# Patient Record
Sex: Female | Born: 1990 | Hispanic: No | Smoking: Former smoker
Health system: Southern US, Community
[De-identification: ages and names within clinical notes are randomized; demographics above are authoritative.]

## PROBLEM LIST (undated history)

## (undated) ENCOUNTER — Inpatient Hospital Stay (HOSPITAL_COMMUNITY): Payer: Self-pay

---
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
## 2016-07-07 ENCOUNTER — Emergency Department (HOSPITAL_COMMUNITY)
Admission: EM | Admit: 2016-07-07 | Discharge: 2016-07-08 | Disposition: A | Discharge status: Home / Self Care | Payer: Managed Care, Other (non HMO) | Attending: Emergency Medicine | Admitting: Emergency Medicine

## 2016-07-07 ENCOUNTER — Emergency Department (HOSPITAL_COMMUNITY): Payer: Managed Care, Other (non HMO)

## 2016-07-07 ENCOUNTER — Encounter (HOSPITAL_COMMUNITY): Payer: Self-pay

## 2016-07-07 DIAGNOSIS — Z79899 Other long term (current) drug therapy: Secondary | ICD-10-CM | POA: Insufficient documentation

## 2016-07-07 DIAGNOSIS — F17200 Nicotine dependence, unspecified, uncomplicated: Secondary | ICD-10-CM | POA: Insufficient documentation

## 2016-07-07 DIAGNOSIS — Y929 Unspecified place or not applicable: Secondary | ICD-10-CM | POA: Diagnosis not present

## 2016-07-07 DIAGNOSIS — X509XXA Other and unspecified overexertion or strenuous movements or postures, initial encounter: Secondary | ICD-10-CM | POA: Insufficient documentation

## 2016-07-07 DIAGNOSIS — S82091A Other fracture of right patella, initial encounter for closed fracture: Secondary | ICD-10-CM | POA: Diagnosis not present

## 2016-07-07 DIAGNOSIS — Y9375 Activity, martial arts: Secondary | ICD-10-CM | POA: Insufficient documentation

## 2016-07-07 DIAGNOSIS — S8991XA Unspecified injury of right lower leg, initial encounter: Secondary | ICD-10-CM | POA: Diagnosis present

## 2016-07-07 DIAGNOSIS — Y999 Unspecified external cause status: Secondary | ICD-10-CM | POA: Diagnosis not present

## 2016-07-07 DIAGNOSIS — T148XXA Other injury of unspecified body region, initial encounter: Secondary | ICD-10-CM

## 2016-07-07 NOTE — ED Triage Notes (Signed)
Was doing karate workout and felt right knee pop no deformity noted good circulation and sensation noted limited movement. Unable to bear weight.

## 2016-07-08 MED ORDER — HYDROCODONE-ACETAMINOPHEN 5-325 MG PO TABS: 1.0000 | ORAL_TABLET | Freq: Four times a day (QID) | ORAL | 0 refills | Status: DC | PRN

## 2016-07-08 MED ORDER — IBUPROFEN 800 MG PO TABS: 800.0000 mg | ORAL_TABLET | Freq: Three times a day (TID) | ORAL | 0 refills | Status: DC | PRN

## 2016-07-08 NOTE — Discharge Instructions (Signed)
Follow up with the orthopedist provided.Ice and elevate the knee.

## 2016-07-09 NOTE — ED Provider Notes (Signed)
[facility]-EMERGENCY DEPT Provider Note   CSN: [id number] Arrival date & time: 07/07/16  2136     History   Chief Complaint Chief Complaint  Patient presents with  . Knee Pain    HPI Tara Hurley is a 26 y.o. female.  HPI Patient presents to the emergency department with right knee pain started after jujitsu class.  Patient states that she was practicing a maneuver when her partner had their leg wrapped around her leg and she twisted and felt a pop in her right knee.  The patient, states she is having pain over the anterior portion of the knee, states that movement and palpation make the pain worse.  She did not take any medications prior to arrival for her symptoms.  Patient denies numbness, weakness, back pain, nausea, vomiting, near syncope or syncope     History reviewed. No pertinent past medical history.  There are no active problems to display for this patient.   History reviewed. No pertinent surgical history.  OB History    No data available       Home Medications    Prior to Admission medications   Medication Sig Start Date End Date Taking? Authorizing Provider  HYDROcodone-acetaminophen (NORCO/VICODIN) 5-325 MG tablet Take 1 tablet by mouth every 6 (six) hours as needed for moderate pain. 07/08/16   Charlestine Night, PA-C  ibuprofen (ADVIL,MOTRIN) 800 MG tablet Take 1 tablet (800 mg total) by mouth every 8 (eight) hours as needed. 07/08/16   Charlestine Night, PA-C    Family History History reviewed. No pertinent family history.  Social History Social History  Substance Use Topics  . Smoking status: Current Some Day Smoker  . Smokeless tobacco: Never Used  . Alcohol use Yes     Allergies   Patient has no known allergies.   Review of Systems Review of Systems All other systems negative except as documented in the HPI. All pertinent positives and negatives as reviewed in the HPI.  Physical Exam Updated Vital Signs BP (!) 148/72 (BP Location: Right  Arm)   Pulse 70   Temp 98.5 F (36.9 C) (Oral)   Resp 14   LMP 06/23/2016   SpO2 99%   Physical Exam  Constitutional: She is oriented to person, place, and time. She appears well-developed and well-nourished. No distress.  HENT:  Head: Normocephalic and atraumatic.  Eyes: Pupils are equal, round, and reactive to light.  Pulmonary/Chest: Effort normal.  Musculoskeletal:       Right knee: She exhibits decreased range of motion and swelling. She exhibits no effusion, no ecchymosis, no deformity, no erythema, normal alignment, no LCL laxity and no MCL laxity. Tenderness found. No patellar tendon tenderness noted.       Legs: Neurological: She is alert and oriented to person, place, and time.  Skin: Skin is warm and dry.  Psychiatric: She has a normal mood and affect.  Nursing note and vitals reviewed.    ED Treatments / Results  Labs (all labs ordered are listed, but only abnormal results are displayed) Labs Reviewed - No data to display  EKG  EKG Interpretation None       Radiology Dg Knee Complete 4 Views Right  Result Date: 07/07/2016 CLINICAL DATA:  Anterior right knee pain. Pt states she felt a pop while practicing jiu-jitsu today. EXAM: RIGHT KNEE - COMPLETE 4+ VIEW COMPARISON:  None. FINDINGS: There is suggestion of focal deformity of the medial aspect of the patella seen only on one view. This could indicate  focal patellar fracture or retinacular avulsion. A small effusion is present. Otherwise, no fracture or dislocation identified in the right knee. No focal bone lesion or bone expansion. IMPRESSION: Focal avulsion fragment demonstrated on the medial aspect of the right patella with small effusion. Electronically Signed   By: Burman Nieves M.D.   On: 07/07/2016 23:55    Procedures Procedures (including critical care time)  Medications Ordered in ED Medications - No data to display   Initial Impression / Assessment and Plan / ED Course  I have reviewed the  triage vital signs and the nursing notes.  Pertinent labs & imaging results that were available during my care of the patient were reviewed by me and considered in my medical decision making (see chart for details).     Patient be placed in knee immobilizer and advised to follow up with orthopedics this area of small avulsion fragment could relate to a possible partial tear.  The patellar tendon, but the patient has good strength in the region.  She can raise his leg without difficulty  Final Clinical Impressions(s) / ED Diagnoses   Final diagnoses:  Avulsion fracture    New Prescriptions Discharge Medication List as of 07/08/2016 12:15 AM    START taking these medications   Details  HYDROcodone-acetaminophen (NORCO/VICODIN) 5-325 MG tablet Take 1 tablet by mouth every 6 (six) hours as needed for moderate pain., Starting Thu 07/08/2016, Print    ibuprofen (ADVIL,MOTRIN) 800 MG tablet Take 1 tablet (800 mg total) by mouth every 8 (eight) hours as needed., Starting Thu 07/08/2016, Print         Charlestine Night, PA-C 07/09/16 0112    Mancel Bale, MD 07/09/16 [phone]

## 2017-04-01 ENCOUNTER — Emergency Department (HOSPITAL_COMMUNITY)
Admission: EM | Admit: 2017-04-01 | Discharge: 2017-04-01 | Disposition: A | Discharge status: Home / Self Care | Payer: Medicaid Other | Attending: Emergency Medicine | Admitting: Emergency Medicine

## 2017-04-01 ENCOUNTER — Encounter (HOSPITAL_COMMUNITY): Payer: Self-pay | Admitting: Emergency Medicine

## 2017-04-01 DIAGNOSIS — O26891 Other specified pregnancy related conditions, first trimester: Secondary | ICD-10-CM

## 2017-04-01 DIAGNOSIS — O99330 Smoking (tobacco) complicating pregnancy, unspecified trimester: Secondary | ICD-10-CM | POA: Diagnosis not present

## 2017-04-01 DIAGNOSIS — F17210 Nicotine dependence, cigarettes, uncomplicated: Secondary | ICD-10-CM | POA: Insufficient documentation

## 2017-04-01 DIAGNOSIS — O9989 Other specified diseases and conditions complicating pregnancy, childbirth and the puerperium: Secondary | ICD-10-CM | POA: Insufficient documentation

## 2017-04-01 DIAGNOSIS — Z3A00 Weeks of gestation of pregnancy not specified: Secondary | ICD-10-CM | POA: Insufficient documentation

## 2017-04-01 DIAGNOSIS — Z79899 Other long term (current) drug therapy: Secondary | ICD-10-CM | POA: Diagnosis not present

## 2017-04-01 DIAGNOSIS — R109 Unspecified abdominal pain: Secondary | ICD-10-CM | POA: Insufficient documentation

## 2017-04-01 LAB — I-STAT BETA HCG BLOOD, ED (MC, WL, AP ONLY): I-stat hCG, quantitative: 1699.6 m[IU]/mL — ABNORMAL HIGH (ref <5)

## 2017-04-01 NOTE — ED Provider Notes (Signed)
[facility]-EMERGENCY DEPT Provider Note   CSN: [id number] Arrival date & time: 04/01/17  0957     History   Chief Complaint Chief Complaint  Patient presents with  . Abdominal Cramping  . Vaginal Bleeding    HPI Tara Hurley is a 27 y.o. female.  HPI   27 year old female whose never been pregnant before presenting with concern for potential pregnancy.  Patient states her last menstrual period was in late November.  She is usually regular with her period.  She recently did 2 pregnancy tests at home and it was positive.  She would like a confirmation test.  She does complain of some mild generalized abdominal discomfort for the past several days.  This morning she noticed some mild vaginal spotting.  She felt that her symptoms is similar to prior menstrual cramping.  No report of fever, chills, lightheadedness, dizziness, chest pain, shortness of breath, back pain, dysuria, hematuria, or rash.  Last alcohol use was 2 weeks ago.  She denies any history of tobacco abuse.    History reviewed. No pertinent past medical history.  There are no active problems to display for this patient.   History reviewed. No pertinent surgical history.  OB History    No data available       Home Medications    Prior to Admission medications   Medication Sig Start Date End Date Taking? Authorizing Provider  HYDROcodone-acetaminophen (NORCO/VICODIN) 5-325 MG tablet Take 1 tablet by mouth every 6 (six) hours as needed for moderate pain. 07/08/16   Lawyer, Cristal Deer, PA-C  ibuprofen (ADVIL,MOTRIN) 800 MG tablet Take 1 tablet (800 mg total) by mouth every 8 (eight) hours as needed. 07/08/16   Charlestine Night, PA-C    Family History No family history on file.  Social History Social History   Tobacco Use  . Smoking status: Current Some Day Smoker  . Smokeless tobacco: Never Used  Substance Use Topics  . Alcohol use: Yes  . Drug use: No     Allergies     Patient has no known allergies.   Review of Systems Review of Systems  All other systems reviewed and are negative.    Physical Exam Updated Vital Signs BP 128/78 (BP Location: Right Arm)   Pulse 73   Temp 98 F (36.7 C) (Oral)   Resp 16   LMP 02/08/2017   SpO2 100%   Physical Exam  Constitutional: She appears well-developed and well-nourished. No distress.  HENT:  Head: Atraumatic.  Eyes: Conjunctivae are normal.  Neck: Neck supple.  Cardiovascular: Normal rate and regular rhythm.  Pulmonary/Chest: Effort normal and breath sounds normal.  Abdominal: Soft. Bowel sounds are normal. She exhibits no distension. There is no tenderness.  Genitourinary:  Genitourinary Comments: Pelvic exam was deferred.  Neurological: She is alert.  Skin: No rash noted.  Psychiatric: She has a normal mood and affect.  Nursing note and vitals reviewed.    ED Treatments / Results  Labs (all labs ordered are listed, but only abnormal results are displayed) Labs Reviewed  I-STAT BETA HCG BLOOD, ED ([facility], [facility], [facility] ONLY) - Abnormal; Notable for the following components:      Result Value   I-stat hCG, quantitative 1,699.6 (*)    All other components within normal limits    EKG  EKG Interpretation None       Radiology No results found.  Procedures Procedures (including critical care time)  Medications Ordered in ED Medications - No data to display  Initial Impression / Assessment and Plan / ED Course  I have reviewed the triage vital signs and the nursing notes.  Pertinent labs & imaging results that were available during my care of the patient were reviewed by me and considered in my medical decision making (see chart for details).     BP 128/78 (BP Location: Right Arm)   Pulse 73   Temp 98 F (36.7 C) (Oral)   Resp 16   LMP 02/08/2017   SpO2 100%    Final Clinical Impressions(s) / ED Diagnoses   Final diagnoses:  Abdominal pain during pregnancy in first trimester     ED Discharge Orders    None     3:12 PM Patient comes in for possible pregnancy after having 2+ pregnancy tests at home.  Her i-STAT hCG is 1699, confirming her possible pregnancy.  She does report some mild abdominal cramping as well as having some vaginal spotting this morning.  I offered a pelvic examination to rule out potential ectopic, threatened miscarriage, or potential STI that he complicated pregnancy.  However patient at this time declined further testing.  She prefers to follow-up with an OB/GYN for further management of her pregnancy.  She understands to return if her condition worsen.  She is well-appearing, and have a benign abdominal exam.  Patient will be discharged home recommend close follow-up with [facility].  Also recommend avoid alcohol tobacco use or any over-the-counter medication that can be harmful for the pregnancy.  Encourage prenatal vitamin.   Fayrene Helperran, Janelli Welling, PA-C 04/01/17 1515    Derwood KaplanNanavati, Ankit, MD 04/02/17 1601

## 2017-04-01 NOTE — ED Notes (Signed)
Pt ambulatory and independent at discharge.  Verbalized understanding of discharge instructions 

## 2017-04-01 NOTE — ED Triage Notes (Signed)
Patient reports that she took home pregnancy test on 6th and was positive. Patient reports that she been having abd cramping since. Today she having bright red spotting.

## 2017-04-01 NOTE — Discharge Instructions (Signed)
Please follow up closely with [facility] for further management of your pregnancy.  Your hCG today is 1699.

## 2017-04-02 ENCOUNTER — Inpatient Hospital Stay (HOSPITAL_COMMUNITY): Payer: Medicaid Other

## 2017-04-02 ENCOUNTER — Inpatient Hospital Stay (HOSPITAL_COMMUNITY)
Admission: AD | Admit: 2017-04-02 | Discharge: 2017-04-02 | Disposition: A | Discharge status: Home / Self Care | Payer: Medicaid Other | Source: Ambulatory Visit | Attending: Obstetrics and Gynecology | Admitting: Obstetrics and Gynecology

## 2017-04-02 ENCOUNTER — Encounter: Payer: Self-pay | Admitting: Student

## 2017-04-02 DIAGNOSIS — O99331 Smoking (tobacco) complicating pregnancy, first trimester: Secondary | ICD-10-CM | POA: Diagnosis not present

## 2017-04-02 DIAGNOSIS — O039 Complete or unspecified spontaneous abortion without complication: Secondary | ICD-10-CM

## 2017-04-02 DIAGNOSIS — Z3A01 Less than 8 weeks gestation of pregnancy: Secondary | ICD-10-CM | POA: Diagnosis not present

## 2017-04-02 DIAGNOSIS — F17210 Nicotine dependence, cigarettes, uncomplicated: Secondary | ICD-10-CM | POA: Insufficient documentation

## 2017-04-02 DIAGNOSIS — O4690 Antepartum hemorrhage, unspecified, unspecified trimester: Secondary | ICD-10-CM

## 2017-04-02 DIAGNOSIS — R109 Unspecified abdominal pain: Secondary | ICD-10-CM | POA: Diagnosis present

## 2017-04-02 LAB — CBC
HEMATOCRIT: 45.2 % (ref 36.0–46.0)
Hemoglobin: 14.8 g/dL (ref 12.0–15.0)
MCH: 28.4 pg (ref 26.0–34.0)
MCHC: 32.7 g/dL (ref 30.0–36.0)
MCV: 86.8 fL (ref 78.0–100.0)
PLATELETS: 183 10*3/uL (ref 150–400)
RBC: 5.21 MIL/uL — AB (ref 3.87–5.11)
RDW: 13 % (ref 11.5–15.5)
WBC: 5 10*3/uL (ref 4.0–10.5)

## 2017-04-02 LAB — URINALYSIS, ROUTINE W REFLEX MICROSCOPIC
BACTERIA UA: NONE SEEN
Bilirubin Urine: NEGATIVE
Glucose, UA: NEGATIVE mg/dL
Ketones, ur: NEGATIVE mg/dL
Leukocytes, UA: NEGATIVE
Nitrite: NEGATIVE
Protein, ur: NEGATIVE mg/dL
SPECIFIC GRAVITY, URINE: 1.025 (ref 1.005–1.030)
pH: 5 (ref 5.0–8.0)

## 2017-04-02 LAB — ABO/RH: ABO/RH(D): A POS

## 2017-04-02 LAB — HCG, QUANTITATIVE, PREGNANCY: hCG, Beta Chain, Quant, S: 920 m[IU]/mL — ABNORMAL HIGH (ref <5)

## 2017-04-02 NOTE — Discharge Instructions (Signed)

## 2017-04-02 NOTE — MAU Note (Signed)
Patient presents with heavy vaginal bleeding and abdominal pain since last night. Bleeding like a period.

## 2017-04-02 NOTE — MAU Provider Note (Signed)
History     CSN: [id number]  Arrival date and time: 04/02/17 1305   First Provider Initiated Contact with Patient 04/02/17 1506      Chief Complaint  Patient presents with  . Vaginal Bleeding  . Abdominal Pain   HPI Tara Hurley is a 27 y.o. G1P0 at [redacted]w[redacted]d who presents with vaginal bleeding & abdominal cramping. Reports spotting started last night but increased to period like bleeding this morning. Reports lower abdominal cramping that she rates 8/10. Has not treated symptoms. Went to [employer] last night & had HCG drawn but declined pelvic exam & ultrasound not done.   OB History    Gravida Para Term Preterm AB Living   1             SAB TAB Ectopic Multiple Live Births                  History reviewed. No pertinent past medical history.  History reviewed. No pertinent surgical history.  History reviewed. No pertinent family history.  Social History   Tobacco Use  . Smoking status: Current Some Day Smoker    Types: Cigarettes  . Smokeless tobacco: Never Used  Substance Use Topics  . Alcohol use: No    Frequency: Never    Comment: 2/wk before pregnancy  . Drug use: Yes    Types: Marijuana    Comment: 6 mos ago    Allergies: No Known Allergies  Medications Prior to Admission  Medication Sig Dispense Refill Last Dose  . Prenatal Vit-Fe Fumarate-FA (PRENATAL MULTIVITAMIN) TABS tablet Take 1 tablet by mouth daily at 12 noon.   04/01/2017 at Unknown time    Review of Systems  Constitutional: Negative.   Gastrointestinal: Positive for abdominal pain.  Genitourinary: Positive for vaginal bleeding.   Physical Exam   Blood pressure 133/83, pulse 80, resp. rate 18, last menstrual period 02/08/2017.  Physical Exam  Nursing note and vitals reviewed. Constitutional: She is oriented to person, place, and time. She appears well-developed and well-nourished. No distress.  HENT:  Head: Normocephalic and atraumatic.  Eyes: Conjunctivae are normal. Right eye exhibits no  discharge. Left eye exhibits no discharge. No scleral icterus.  Neck: Normal range of motion.  Respiratory: Effort normal. No respiratory distress.  GI: Soft. She exhibits no distension. There is no tenderness. There is no rebound.  Genitourinary: There is bleeding in the vagina.  Genitourinary Comments: 3 cm piece of gray tissue removed from cervical os. Minimal dark red bleeding behind tissue.   Neurological: She is alert and oriented to person, place, and time.  Skin: Skin is warm and dry. She is not diaphoretic.  Psychiatric: She has a normal mood and affect. Her behavior is normal. Judgment and thought content normal.    MAU Course  Procedures Results for orders placed or performed during the hospital encounter of 04/02/17 (from the past 48 hour(s))  Urinalysis, Routine w reflex microscopic     Status: Abnormal   Collection Time: 04/02/17  1:15 PM  Result Value Ref Range   Color, Urine YELLOW YELLOW   APPearance HAZY (A) CLEAR   Specific Gravity, Urine 1.025 1.005 - 1.030   pH 5.0 5.0 - 8.0   Glucose, UA NEGATIVE NEGATIVE mg/dL   Hgb urine dipstick MODERATE (A) NEGATIVE   Bilirubin Urine NEGATIVE NEGATIVE   Ketones, ur NEGATIVE NEGATIVE mg/dL   Protein, ur NEGATIVE NEGATIVE mg/dL   Nitrite NEGATIVE NEGATIVE   Leukocytes, UA NEGATIVE NEGATIVE   RBC / HPF  0-5 0 - 5 RBC/hpf   WBC, UA 0-5 0 - 5 WBC/hpf   Bacteria, UA NONE SEEN NONE SEEN   Squamous Epithelial / LPF 0-5 (A) NONE SEEN   Mucus PRESENT   CBC     Status: Abnormal   Collection Time: 04/02/17  2:02 PM  Result Value Ref Range   WBC 5.0 4.0 - 10.5 K/uL   RBC 5.21 (H) 3.87 - 5.11 MIL/uL   Hemoglobin 14.8 12.0 - 15.0 g/dL   HCT 96.045.2 45.436.0 - 09.846.0 %   MCV 86.8 78.0 - 100.0 fL   MCH 28.4 26.0 - 34.0 pg   MCHC 32.7 30.0 - 36.0 g/dL   RDW 11.913.0 14.711.5 - 82.915.5 %   Platelets 183 150 - 400 K/uL  hCG, quantitative, pregnancy     Status: Abnormal   Collection Time: 04/02/17  2:02 PM  Result Value Ref Range   hCG, Beta Chain,  Quant, S 920 (H) <5 mIU/mL    Comment:          GEST. AGE      CONC.  (mIU/mL)   <=1 WEEK        5 - 50     2 WEEKS       50 - 500     3 WEEKS       100 - 10,000     4 WEEKS     1,000 - 30,000     5 WEEKS     3,500 - 115,000   6-8 WEEKS     12,000 - 270,000    12 WEEKS     15,000 - 220,000        FEMALE AND NON-PREGNANT FEMALE:     LESS THAN 5 mIU/mL   ABO/Rh     Status: None   Collection Time: 04/02/17  2:02 PM  Result Value Ref Range   ABO/RH(D) A POS      MDM +UPT UA, wet prep, GC/chlamydia, CBC, ABO/Rh, quant hCG, HIV, and US today to rule out ectopic pregnancy A positive Ultrasound shows no IUP or adnexal mass. During exam, tissue was removed from os. Hcg has dropped to 920 from 1699 last night.  Pt reports improvement in symptoms since exam.   Assessment and Plan  A: 1. Miscarriage   2. Vaginal bleeding in pregnancy    P: Discharge home Pelvic rest msg to [facility] for f/u hcg in 1 weeks & miscarriage follow up in 2 weeks Discussed reasons to return to MAU  Judeth HornErin Eudell Mcphee 04/02/2017, 3:06 PM

## 2017-04-02 NOTE — Progress Notes (Signed)
E signature page not working in room 4.  Patient signed printed copy of AVS signature page.

## 2017-04-05 ENCOUNTER — Ambulatory Visit: Payer: Self-pay

## 2017-04-07 ENCOUNTER — Ambulatory Visit: Payer: Self-pay | Admitting: *Deleted

## 2017-04-07 DIAGNOSIS — O3680X0 Pregnancy with inconclusive fetal viability, not applicable or unspecified: Secondary | ICD-10-CM

## 2017-04-07 LAB — HCG, QUANTITATIVE, PREGNANCY: hCG, Beta Chain, Quant, S: 105 m[IU]/mL — ABNORMAL HIGH (ref <5)

## 2017-04-07 NOTE — Addendum Note (Signed)
Addended by: Gerome ApleyZEYFANG, LINDA L on: 04/07/2017 01:52 PM   Modules accepted: Level of Service

## 2017-04-07 NOTE — Progress Notes (Signed)
Here for stat bhcg. .she states she is aware is probably miscarriage. Denies pain or bleeding except for spotting.  Explained to patient will have her wait for results in lobby; about 1.5 hours then will review with provider and her.   1:10 Results back, reviewed with Donnita FallsKathryn Kooistra,CNM. Patient not in lobby- notified by front office she told them she had to leave. Called patient and notified her levels dropped signifcantly and looks like is miscarriage. Notified her reccommendations are to have repeat non-stat bhcg in one week and see provider in 2 weeks and that registar will call her with appt. She voices understanding.

## 2017-04-07 NOTE — Progress Notes (Signed)
Chart reviewed for nurse visit. Agree with plan of care.   Marylene LandKooistra, Eyana Stolze Lorraine, CNM 04/07/2017 4:10 PM

## 2017-04-15 ENCOUNTER — Other Ambulatory Visit: Payer: Self-pay | Admitting: General Practice

## 2017-04-15 ENCOUNTER — Other Ambulatory Visit: Payer: Self-pay

## 2017-04-15 DIAGNOSIS — O039 Complete or unspecified spontaneous abortion without complication: Secondary | ICD-10-CM

## 2017-04-19 ENCOUNTER — Ambulatory Visit: Payer: Self-pay | Admitting: Advanced Practice Midwife

## 2017-04-20 ENCOUNTER — Ambulatory Visit: Payer: Medicaid Other

## 2017-04-20 ENCOUNTER — Encounter: Payer: Self-pay | Admitting: General Practice

## 2017-04-20 VITALS — BP 126/79 | HR 55 | Wt 147.0 lb

## 2017-04-20 DIAGNOSIS — O039 Complete or unspecified spontaneous abortion without complication: Secondary | ICD-10-CM

## 2017-04-20 MED ORDER — NORGESTIMATE-ETH ESTRADIOL 0.25-35 MG-MCG PO TABS: 1.0000 | ORAL_TABLET | Freq: Every day | ORAL | 11 refills | Status: AC

## 2017-04-20 NOTE — Progress Notes (Signed)
History:  Ms. Tara Hurley is a 27 y.o. G1P0 who presents to clinic today for follow up after a miscarriage. Patient denies any bleeding or pain. Desires contraception at this visit, does not want to get pregnant at this time.    The following portions of the patient's history were reviewed and updated as appropriate: allergies, current medications, family history, past medical history, social history, past surgical history and problem list.  Review of Systems:  Review of Systems  Constitutional: Negative.  Negative for chills and fever.  Respiratory: Negative.   Cardiovascular: Negative.  Negative for chest pain.  Genitourinary: Negative.   Neurological: Negative.  Negative for dizziness and headaches.      Objective:  Physical Exam BP 126/79   Pulse (!) 55   Wt 147 lb (66.7 kg)   LMP 02/08/2017  Physical Exam  Constitutional: She is oriented to person, place, and time. She appears well-developed and well-nourished. No distress.  HENT:  Head: Normocephalic.  Eyes: Pupils are equal, round, and reactive to light.  Neck: Normal range of motion.  Cardiovascular: Normal rate and regular rhythm.  Pulmonary/Chest: Effort normal and breath sounds normal. No respiratory distress.  Abdominal: Soft. There is no tenderness.  Musculoskeletal: Normal range of motion.  Neurological: She is alert and oriented to person, place, and time.  Skin: Skin is warm and dry.  Psychiatric: She has a normal mood and affect. Her behavior is normal. Judgment and thought content normal.  Nursing note and vitals reviewed.   Assessment & Plan:  1. Miscarriage - Extensive conversation with patient about reasons for miscarriage and future pregnancies. -Patient does not desire pregnancy at this time. Methods of birth control reviewed and patient desires OCPs   - Beta HCG, Quant - norgestimate-ethinyl estradiol (ORTHO-CYCLEN,SPRINTEC,PREVIFEM) 0.25-35 MG-MCG tablet; Take 1 tablet by mouth daily.  Dispense:  1 Package; Refill: [address]   Rolm Bookbindereill, Philicia Heyne M, [place] 04/20/2017 4:04 PM

## 2017-04-21 LAB — BETA HCG QUANT (REF LAB): hCG Quant: 2 m[IU]/mL

## 2018-02-07 ENCOUNTER — Encounter (HOSPITAL_COMMUNITY): Payer: Self-pay

## 2018-03-24 IMAGING — US US OB < 14 WEEKS - US OB TV
1 series · 15 of 28 positions shown · non-contrast
Comparison: None.

CLINICAL DATA: Bleeding

EXAM:
OBSTETRIC <14 WK US AND TRANSVAGINAL OB US
TECHNIQUE: Both transabdominal and transvaginal ultrasound examinations were
performed for complete evaluation of the gestation as well as the
maternal uterus, adnexal regions, and pelvic cul-de-sac.
Transvaginal technique was performed to assess early pregnancy.

[Series 1: us ob < 14 weeks - us ob tv · 15 of 34 slices shown]
[im 1/34]
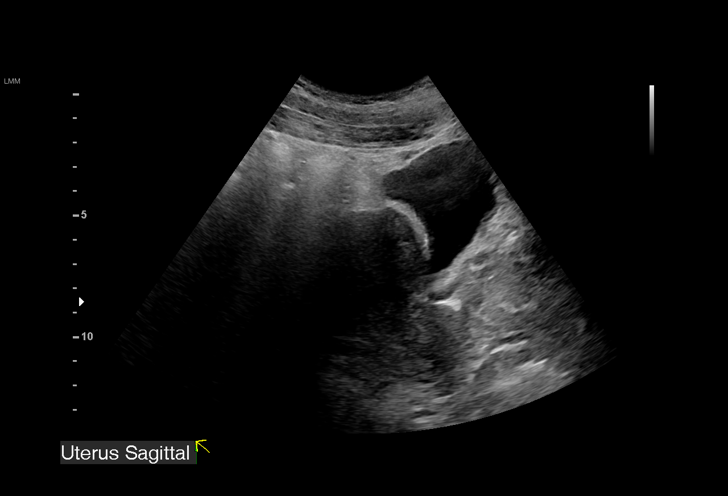
[im 3/34]
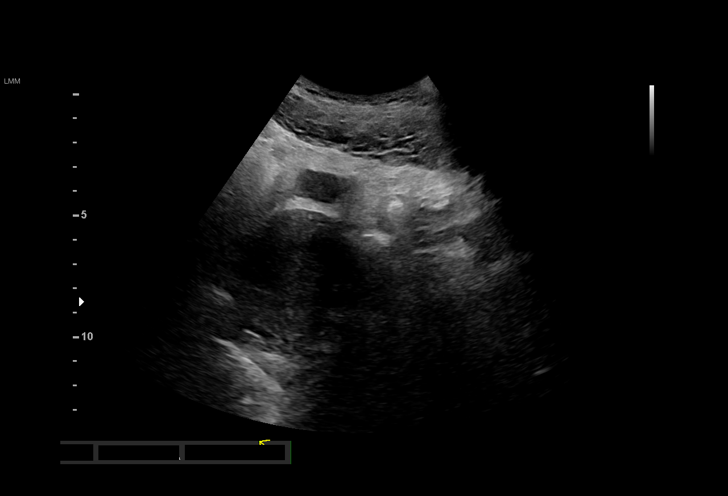
[im 5/34]
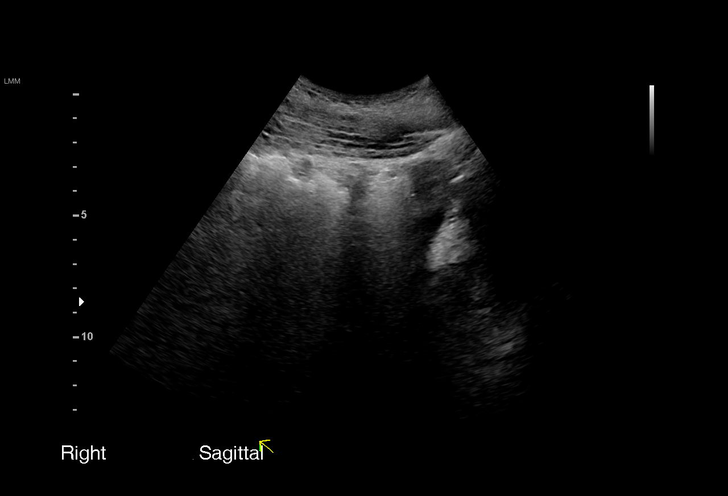
[im 8/34]
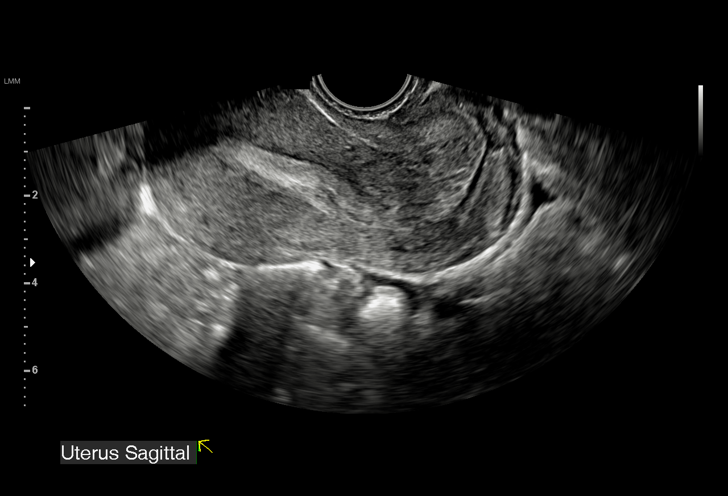
[im 10/34]
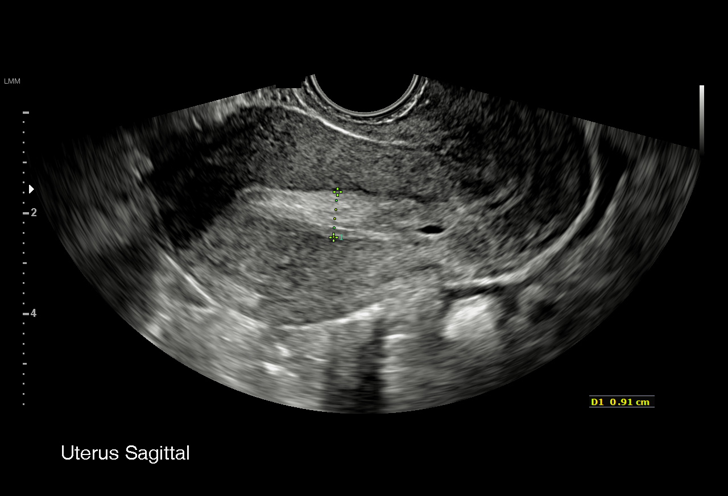
[im 13/34]
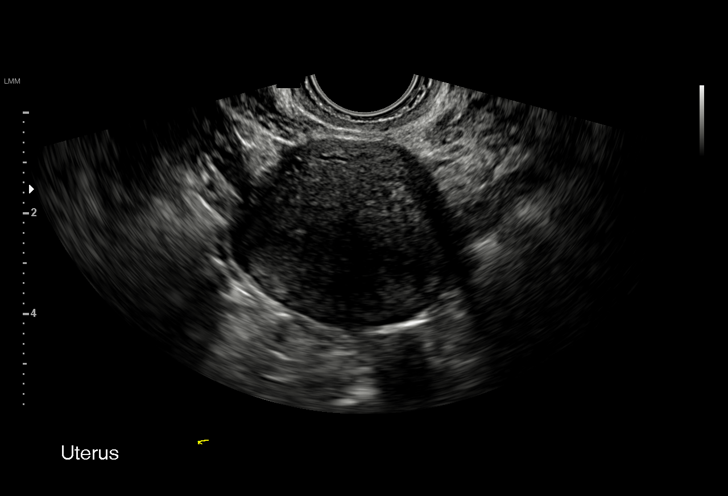
[im 15/34]
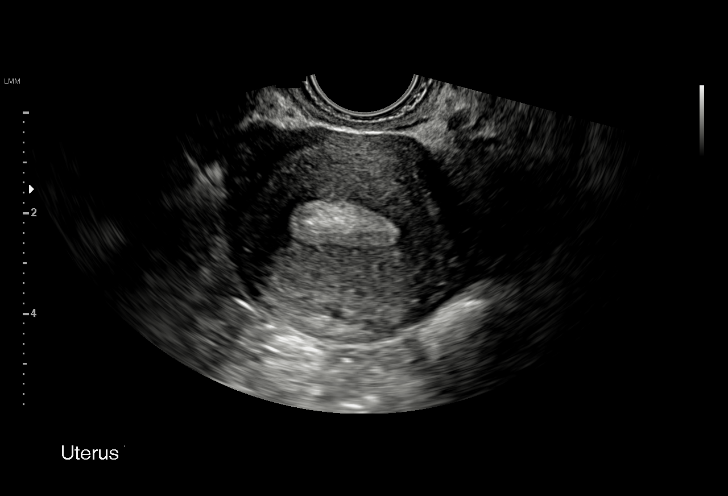
[im 18/34]
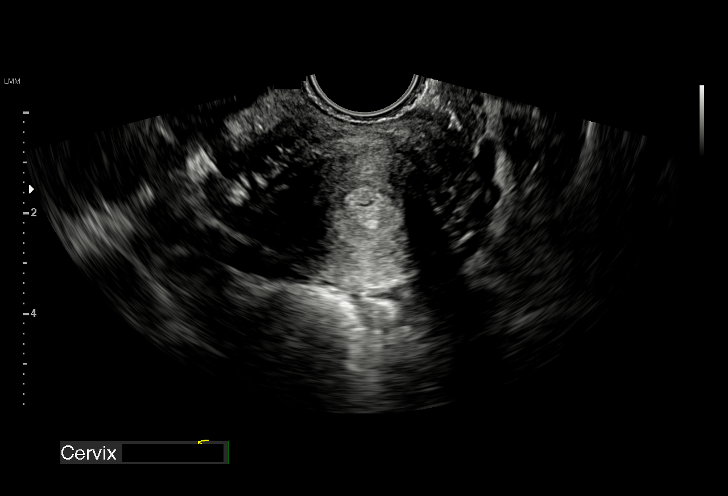
[im 19/34]
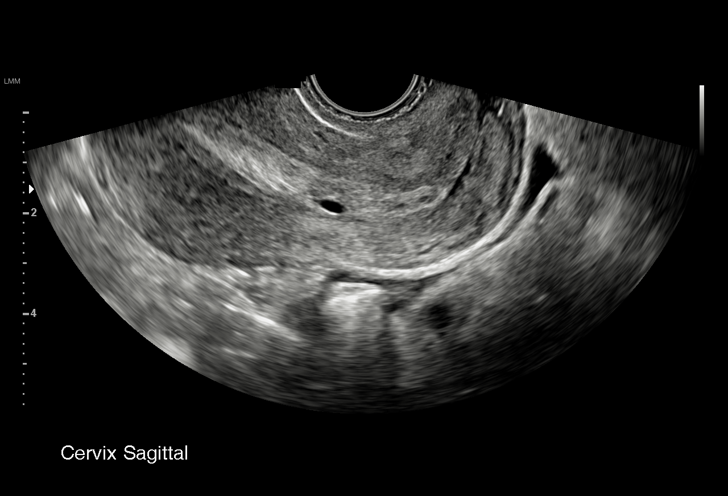
[im 21/34]
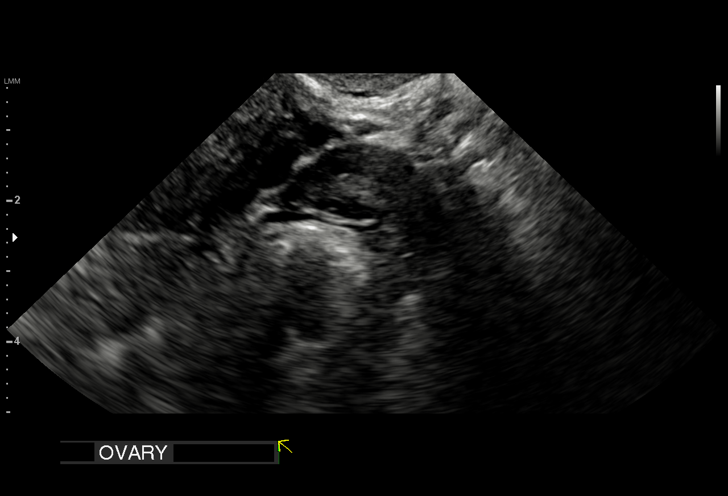
[im 24/34]
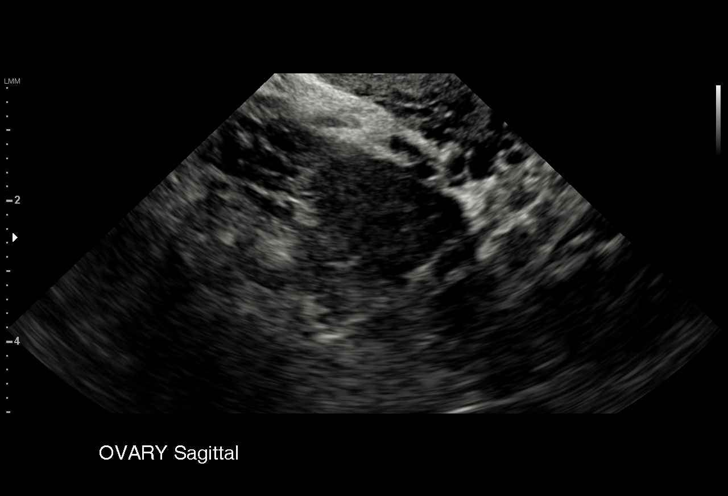
[im 26/34]
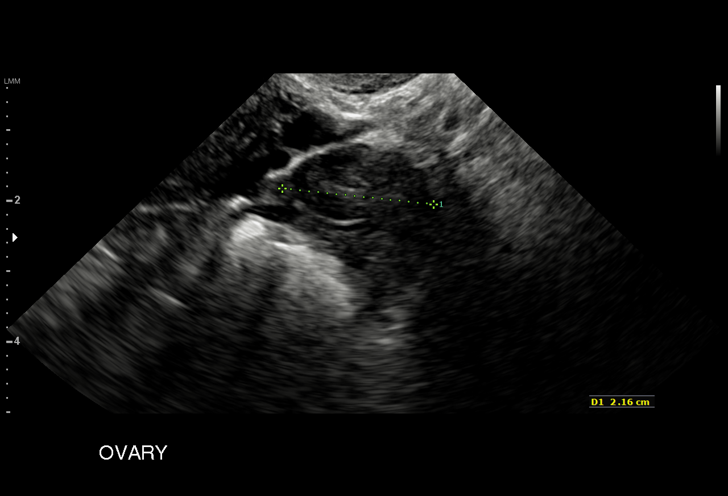
[im 29/34]
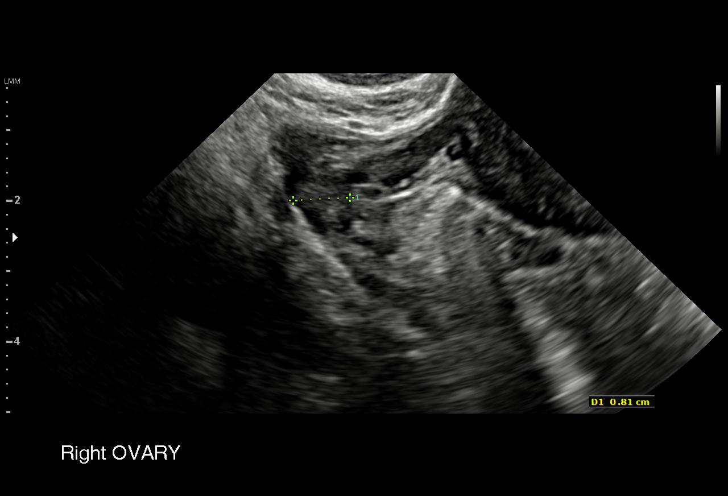
[im 31/34]
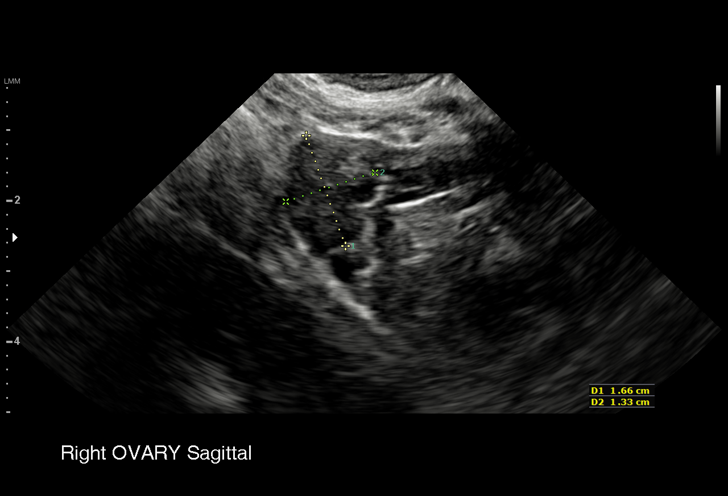
[im 34/34]
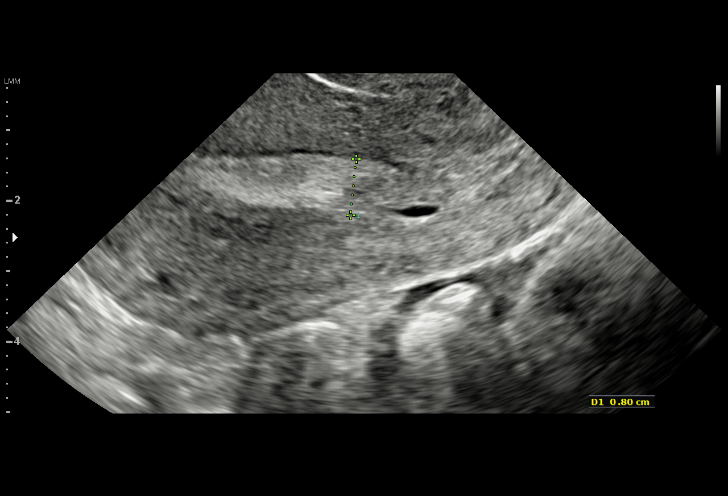

[15 of 28 positions shown; findings below may reference images not displayed]

FINDINGS: Intrauterine gestational sac: Not definitively visualized. There is
a small elongated fluid collection in the lower uterine segment
region. I favor this represents a small amount of fluid in the
endometrial canal.

Yolk sac:  Not visualized

Embryo:  Not visualized

Cardiac Activity:

Heart Rate:   bpm

MSD:   mm    w     d

CRL:    mm    w    d                  US EDC:

Subchorionic hemorrhage:  None visualized.

Maternal uterus/adnexae: No adnexal mass or free fluid.
IMPRESSION: No intrauterine pregnancy visualized. Differential considerations
would include early intrauterine pregnancy too early to visualize,
spontaneous abortion, or occult ectopic pregnancy. Recommend close
clinical followup and serial quantitative beta HCGs and ultrasounds.
